# Patient Record
Sex: Male | Born: 1975 | Race: White | Hispanic: No | Marital: Single | Smoking: Current some day smoker
Health system: Southern US, Community
[De-identification: ages and names within clinical notes are randomized; demographics above are authoritative.]

---

## 2005-12-01 ENCOUNTER — Emergency Department: Payer: Self-pay | Admitting: Emergency Medicine

## 2006-08-06 HISTORY — PX: KNEE SURGERY: SHX244

## 2006-12-24 ENCOUNTER — Emergency Department: Payer: Self-pay | Admitting: Emergency Medicine

## 2007-10-24 ENCOUNTER — Emergency Department: Payer: Self-pay | Admitting: Unknown Physician Specialty

## 2007-10-25 ENCOUNTER — Other Ambulatory Visit: Payer: Self-pay

## 2007-11-01 ENCOUNTER — Emergency Department (HOSPITAL_COMMUNITY): Admission: EM | Admit: 2007-11-01 | Discharge: 2007-11-01 | Payer: Self-pay | Admitting: Emergency Medicine
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
## 2019-03-09 ENCOUNTER — Other Ambulatory Visit: Payer: Self-pay

## 2019-05-08 ENCOUNTER — Other Ambulatory Visit: Payer: Self-pay

## 2019-05-08 ENCOUNTER — Emergency Department: Payer: BLUE CROSS/BLUE SHIELD

## 2019-05-08 ENCOUNTER — Emergency Department
Admission: EM | Admit: 2019-05-08 | Discharge: 2019-05-08 | Disposition: A | Discharge status: Home / Self Care | Payer: BLUE CROSS/BLUE SHIELD | Attending: Emergency Medicine | Admitting: Emergency Medicine

## 2019-05-08 DIAGNOSIS — N453 Epididymo-orchitis: Secondary | ICD-10-CM

## 2019-05-08 DIAGNOSIS — N50819 Testicular pain, unspecified: Secondary | ICD-10-CM

## 2019-05-08 DIAGNOSIS — N50811 Right testicular pain: Secondary | ICD-10-CM | POA: Diagnosis present

## 2019-05-08 DIAGNOSIS — F17200 Nicotine dependence, unspecified, uncomplicated: Secondary | ICD-10-CM | POA: Insufficient documentation

## 2019-05-08 DIAGNOSIS — N5089 Other specified disorders of the male genital organs: Secondary | ICD-10-CM

## 2019-05-08 LAB — URINALYSIS, COMPLETE (UACMP) WITH MICROSCOPIC
Bacteria, UA: NONE SEEN
Bilirubin Urine: NEGATIVE
Glucose, UA: NEGATIVE mg/dL
Hgb urine dipstick: NEGATIVE
Ketones, ur: NEGATIVE mg/dL
Nitrite: NEGATIVE
Protein, ur: NEGATIVE mg/dL
Specific Gravity, Urine: 1.008 (ref 1.005–1.030)
Squamous Epithelial / LPF: NONE SEEN (ref 0–5)
pH: 6 (ref 5.0–8.0)

## 2019-05-08 MED ORDER — IBUPROFEN 800 MG PO TABS: 800.0000 mg | ORAL_TABLET | Freq: Three times a day (TID) | ORAL | 0 refills | Status: DC | PRN

## 2019-05-08 MED ORDER — LEVOFLOXACIN 500 MG PO TABS: 500.0000 mg | ORAL_TABLET | Freq: Every day | ORAL | 0 refills | Status: AC

## 2019-05-08 NOTE — ED Triage Notes (Signed)
Reports sudden onset of right testicular pain that started yesterday. Reports pain is not as sever as with onset but is still constant. Denies urinary sx. Pt alert and oriented X4, cooperative, RR even and unlabored, color WNL. Pt in NAD.

## 2019-05-08 NOTE — ED Notes (Signed)
Pt returned to lobby from U/S.

## 2019-05-08 NOTE — ED Notes (Signed)
Patient transported to Ultrasound from lobby

## 2019-05-08 NOTE — ED Notes (Signed)
Encouraged patient to provide urine sample, states unable to do so at this time. Given labeled urine cup and instructed to provide when able.

## 2019-05-08 NOTE — ED Provider Notes (Signed)
[facility] Emergency Department Provider Note       Time seen: ----------------------------------------- 4:29 PM on 05/08/2019 -----------------------------------------   I have reviewed the triage vital signs and the nursing notes.  HISTORY   Chief Complaint Testicle Pain    HPI Curtis Andrews is a 43 y.o. male with no significant past medical history who presents to the ED for right testicular pain that started yesterday.  Patient reports pain is not as severe as onset of yesterday but still constant.  He is never had this before.  Pain is 9 out of 10 in the right testicle.  History reviewed. No pertinent past medical history.  There are no active problems to display for this patient.   History reviewed. No pertinent surgical history.  Allergies Patient has no known allergies.  Social History Social History   Tobacco Use  . Smoking status: Current Some Day Smoker  Substance Use Topics  . Alcohol use: Yes  . Drug use: Not on file   Review of Systems Constitutional: Negative for fever. Cardiovascular: Negative for chest pain. Respiratory: Negative for shortness of breath. Gastrointestinal: Negative for abdominal pain, vomiting and diarrhea. Genitourinary: Positive for right testicular pain Musculoskeletal: Negative for back pain. Skin: Negative for rash. Neurological: Negative for headaches, focal weakness or numbness.  All systems negative/normal/unremarkable except as stated in the HPI  ____________________________________________   PHYSICAL EXAM:  VITAL SIGNS: ED Triage Vitals  Enc Vitals Group     BP 05/08/19 1125 (!) 142/82     Pulse Rate 05/08/19 1125 81     Resp 05/08/19 1125 16     Temp 05/08/19 1125 99 F (37.2 C)     Temp Source 05/08/19 1125 Oral     SpO2 05/08/19 1125 98 %     Weight 05/08/19 1131 165 lb (74.8 kg)     Height 05/08/19 1131 6' (1.829 m)     Head Circumference --      Peak Flow --      Pain Score  05/08/19 1131 9     Pain Loc --      Pain Edu? --      Excl. in GC? --     Constitutional: Alert and oriented. Well appearing and in no distress. Genitourinary: Right testicle is slightly enlarged and mildly tender.  He does have erythema of the scrotum on the right side.  No hernia is palpated. Musculoskeletal: Nontender with normal range of motion in extremities. No lower extremity tenderness nor edema. Neurologic:  Normal speech and language. No gross focal neurologic deficits are appreciated.  Skin:  Skin is warm, dry and intact. No rash noted. Psychiatric: Mood and affect are normal. Speech and behavior are normal.  ____________________________________________  ED COURSE:  As part of my medical decision making, I reviewed the following data within the electronic MEDICAL RECORD NUMBER History obtained from family if available, nursing notes, old chart and ekg, as well as notes from prior ED visits. Patient presented for right-sided testicular pain, we will assess with labs and imaging as indicated at this time.   Procedures  SUFYAAN PALMA was evaluated in Emergency Department on 05/08/2019 for the symptoms described in the history of present illness. He was evaluated in the context of the global COVID-19 pandemic, which necessitated consideration that the patient might be at risk for infection with the SARS-CoV-2 virus that causes COVID-19. Institutional protocols and algorithms that pertain to the evaluation of patients at risk for COVID-19 are in  a state of rapid change based on information released by regulatory bodies including the CDC and federal and state organizations. These policies and algorithms were followed during the patient's care in the ED.  ____________________________________________   LABS (pertinent positives/negatives)  Labs Reviewed  URINALYSIS, COMPLETE (UACMP) WITH MICROSCOPIC - Abnormal; Notable for the following components:      Result Value   Color, Urine YELLOW  (*)    APPearance CLEAR (*)    Leukocytes,Ua MODERATE (*)    All other components within normal limits  URINE CULTURE  GC/CHLAMYDIA PROBE AMP    RADIOLOGY Images were viewed by me Testicular ultrasound IMPRESSION: Small right hydrocele.  No evidence of testicular torsion.   ____________________________________________   DIFFERENTIAL DIAGNOSIS   Epididymoorchitis, torsion, hernia, kidney stone  FINAL ASSESSMENT AND PLAN  Right testicular pain   Plan: The patient had presented for right-sided testicular pain. Patient's labs revealed some leukocytes in his urine.  Patient's imaging revealed a small right hydrocele but otherwise no evidence of torsion.  Clinically has a normal lie and appearance to his testicle and scrotum although slightly enlarged and tender.  He states he has not had unprotected sex, I will cover with levofloxacin 500 mg orally for 10 days.  He will be referred to urology for outpatient follow-up as needed   Laurence Aly, MD    Note: This note was generated in part or whole with voice recognition software. Voice recognition is usually quite accurate but there are transcription errors that can and very often do occur. I apologize for any typographical errors that were not detected and corrected.     Earleen Newport, MD 05/08/19 [id number]

## 2019-05-08 NOTE — ED Notes (Signed)
Pt to first nurse desk, states that he is going to step to his car for a few minutes. Pt is ambulatory without distress at this time.

## 2019-05-10 LAB — URINE CULTURE
Culture: NO GROWTH
Special Requests: NORMAL

## 2019-05-11 LAB — GC/CHLAMYDIA PROBE AMP
Chlamydia trachomatis, NAA: NEGATIVE
Neisseria Gonorrhoeae by PCR: NEGATIVE

## 2020-01-08 IMAGING — US US SCROTUM W/ DOPPLER COMPLETE
1 series · 14 of 25 positions shown · non-contrast
Comparison: None.

CLINICAL DATA: Pain and swelling of the right testicle.

EXAM:
SCROTAL ULTRASOUND
DOPPLER ULTRASOUND OF THE TESTICLES
TECHNIQUE: Complete ultrasound examination of the testicles, epididymis, and
other scrotal structures was performed. Color and spectral Doppler
ultrasound were also utilized to evaluate blood flow to the
testicles.

[Series 1: us scrotum w/ doppler complete · 0.08mm/px · 14 of 43 slices shown]
[im 1/43]
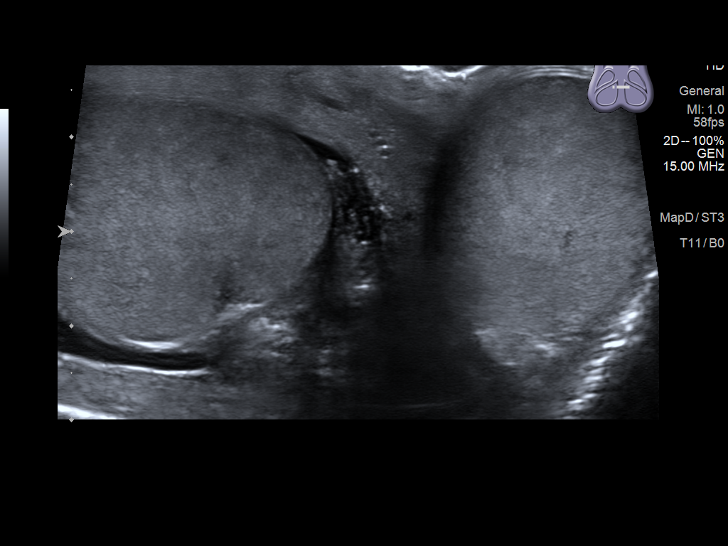
[im 4/43]
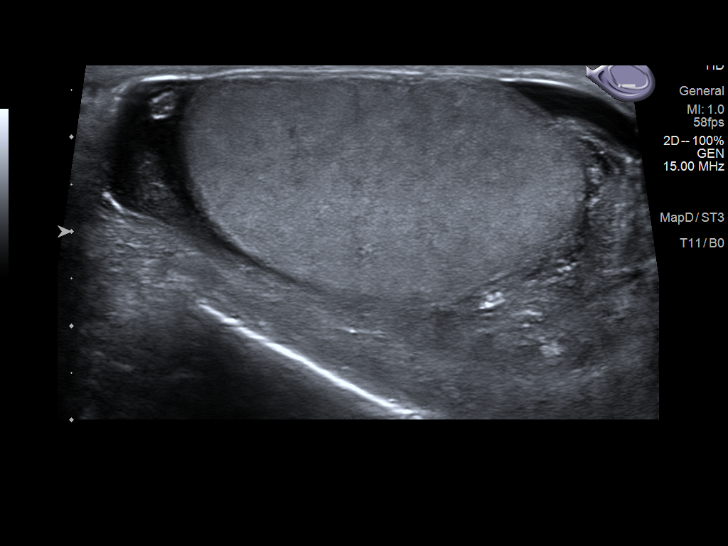
[im 8/43]
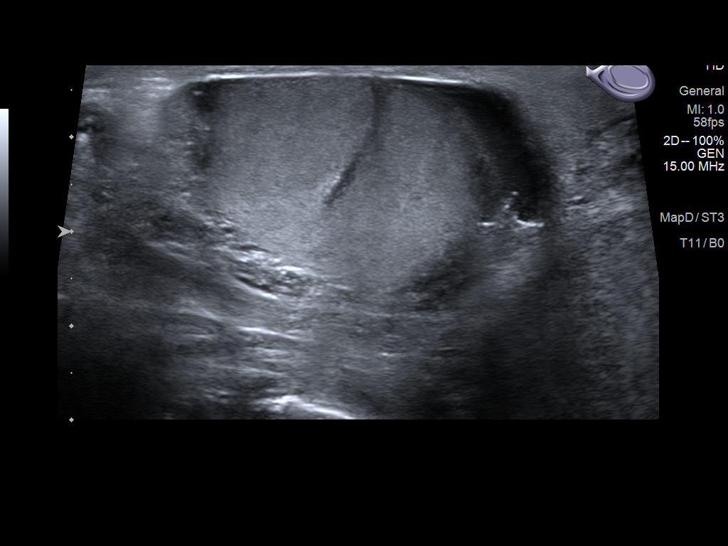
[im 11/43]
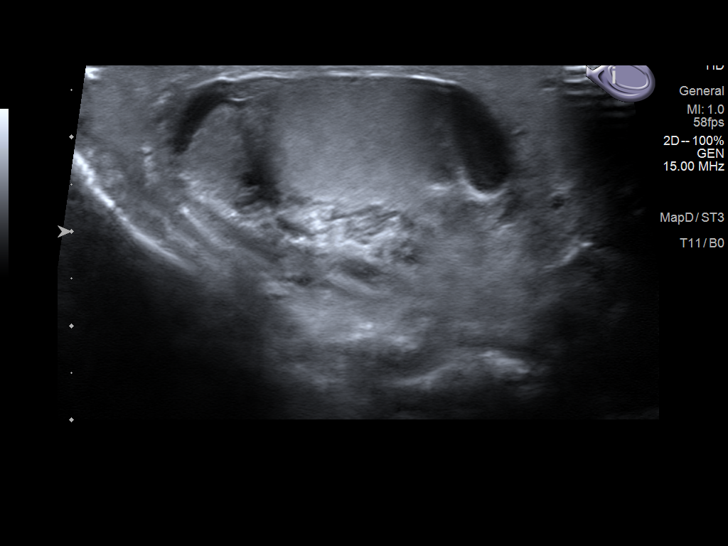
[im 15/43]
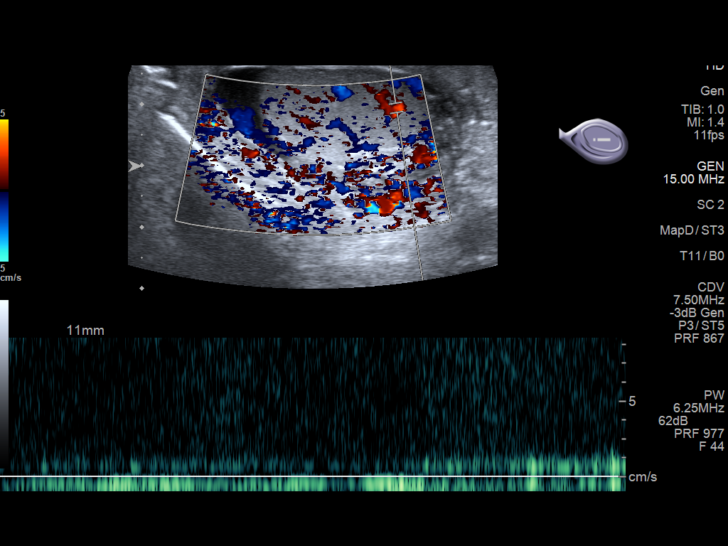
[im 16/43]
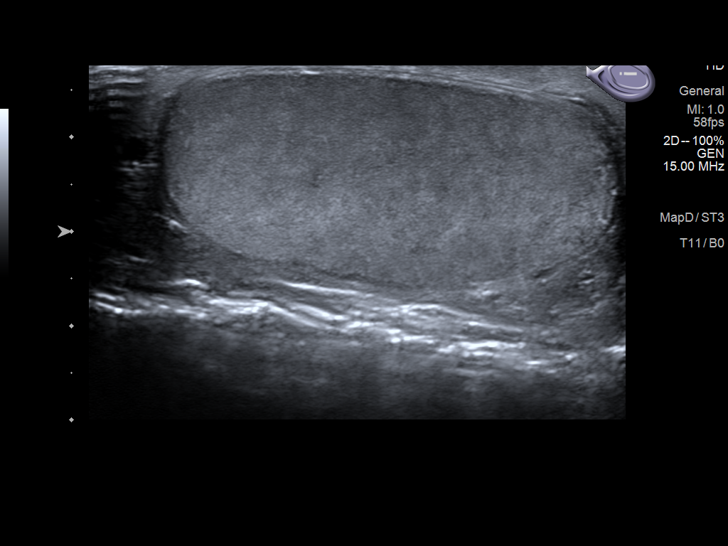
[im 20/43]
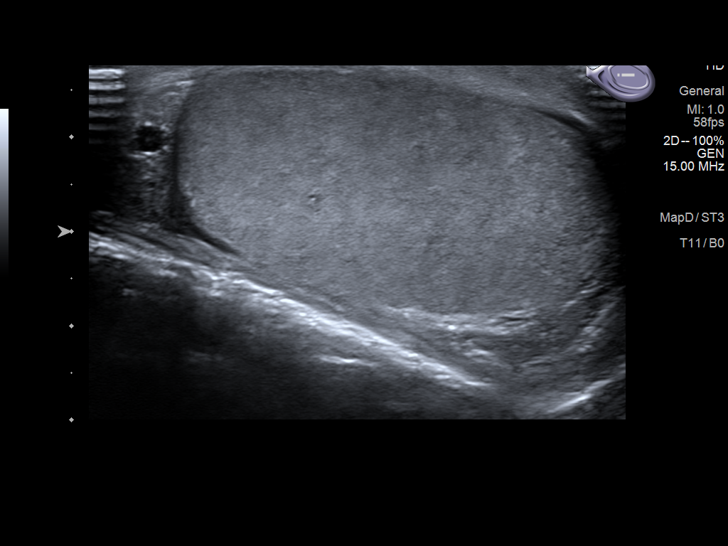
[im 23/43]
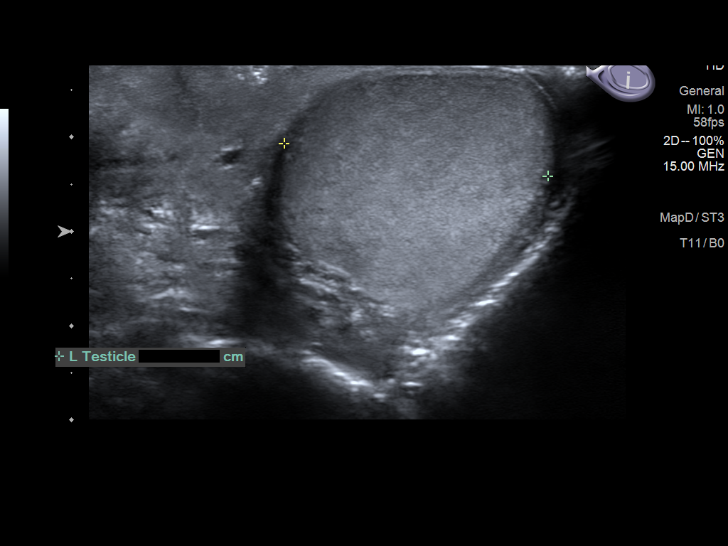
[im 27/43]
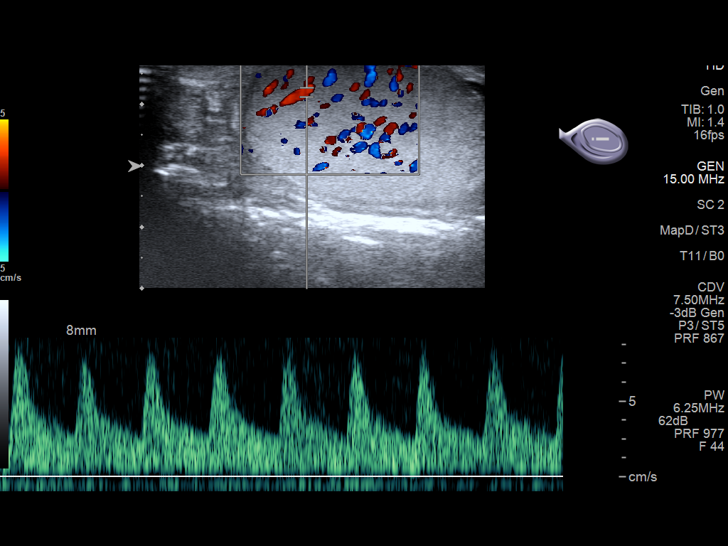
[im 29/43]
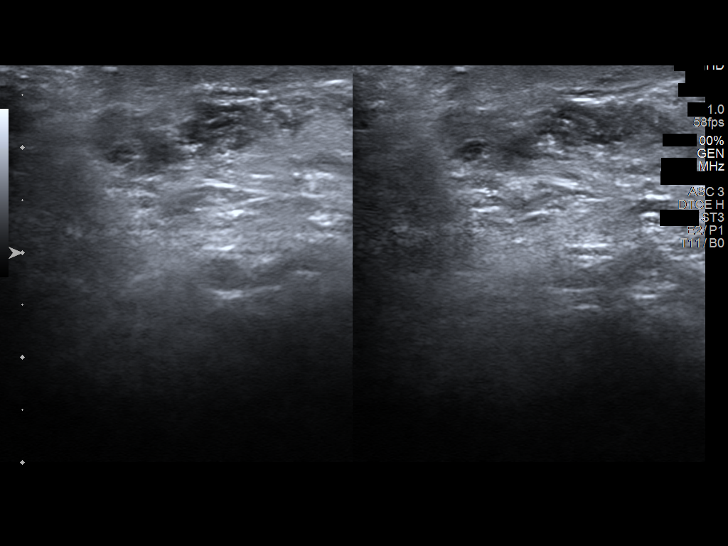
[im 32/43]
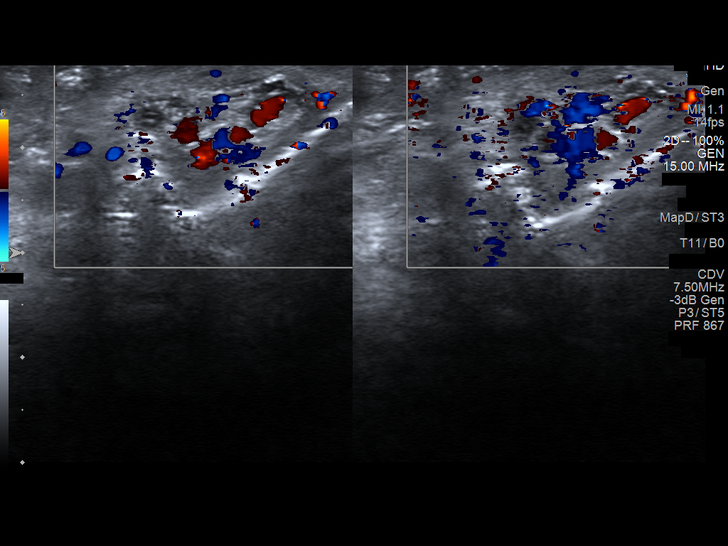
[im 36/43]
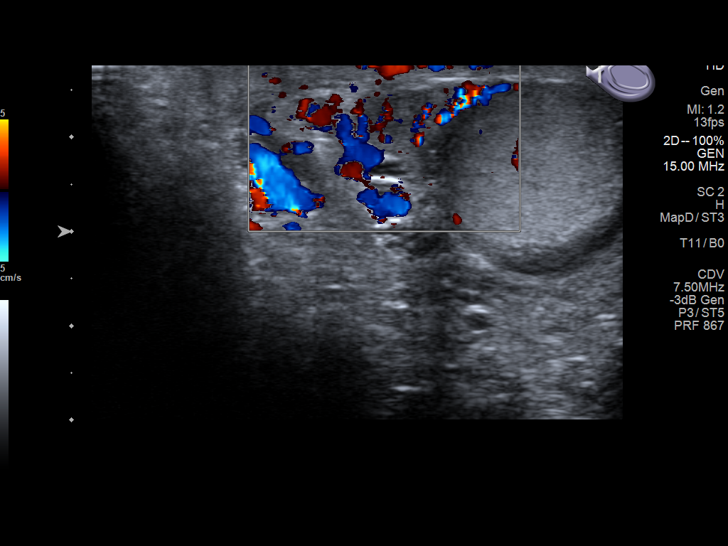
[im 39/43]
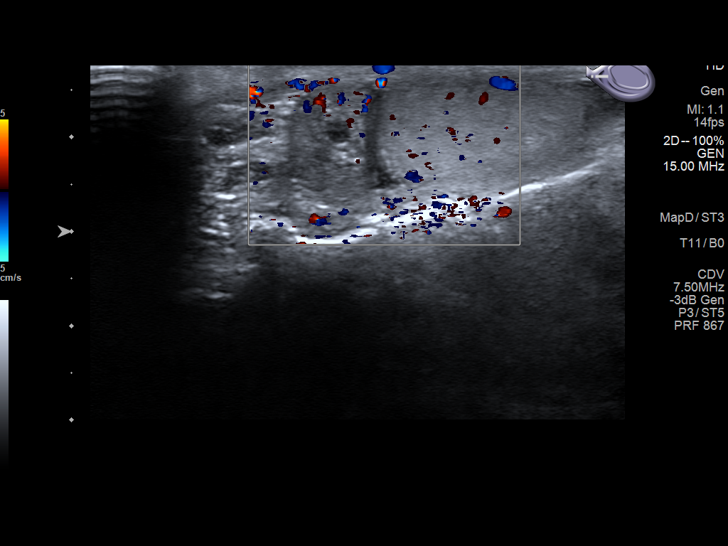
[im 43/43]
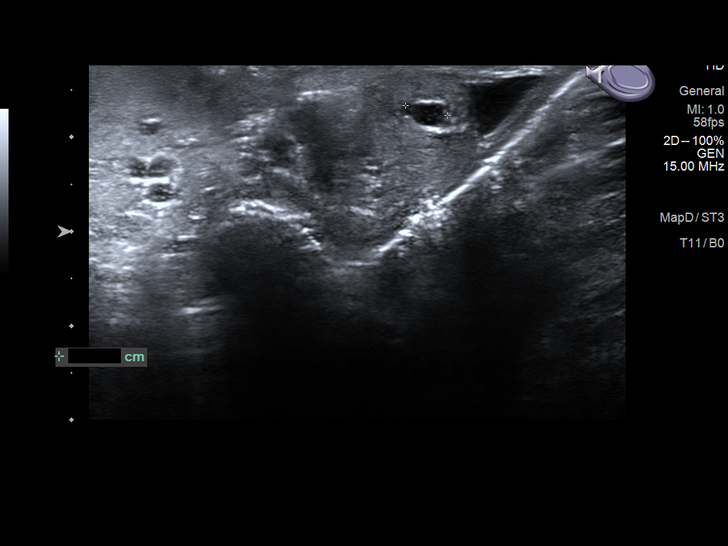

[14 of 25 positions shown; findings below may reference images not displayed]

FINDINGS: Right testicle

Measurements: 4.4 x 2.5 x 3.5 cm. No mass or microlithiasis
visualized.

Left testicle

Measurements: 4.8 x 2.2 x 2.8 cm. No mass or microlithiasis
visualized.

Right epididymis:  Normal in size and appearance.

Left epididymis: Normal in size. A small epididymal cyst measures 3
mm.

Hydrocele: There is a small right hydrocele. There is no left
hydrocele.

Varicocele:  None visualized.

Pulsed Doppler interrogation of both testes demonstrates normal low
resistance arterial and venous waveforms bilaterally.
IMPRESSION: Small right hydrocele.  No evidence of testicular torsion.

## 2022-07-25 ENCOUNTER — Telehealth: Payer: Self-pay | Admitting: Nurse Practitioner

## 2022-07-25 NOTE — Telephone Encounter (Signed)
Lvm to reschedule 08/24/22 appointment-Toni 

## 2022-08-21 ENCOUNTER — Encounter: Payer: Self-pay | Admitting: Nurse Practitioner

## 2022-08-21 ENCOUNTER — Ambulatory Visit (INDEPENDENT_AMBULATORY_CARE_PROVIDER_SITE_OTHER): Payer: BLUE CROSS/BLUE SHIELD | Admitting: Nurse Practitioner

## 2022-08-21 VITALS — BP 145/78 | HR 72 | Temp 97.8°F | Resp 16 | Ht 72.0 in | Wt 167.8 lb

## 2022-08-21 DIAGNOSIS — Z1212 Encounter for screening for malignant neoplasm of rectum: Secondary | ICD-10-CM | POA: Diagnosis not present

## 2022-08-21 DIAGNOSIS — E782 Mixed hyperlipidemia: Secondary | ICD-10-CM

## 2022-08-21 DIAGNOSIS — Z1211 Encounter for screening for malignant neoplasm of colon: Secondary | ICD-10-CM

## 2022-08-21 DIAGNOSIS — Z0000 Encounter for general adult medical examination without abnormal findings: Secondary | ICD-10-CM

## 2022-08-21 NOTE — Progress Notes (Signed)
[facility] [place], [place] [address]  Internal MEDICINE  Office Visit Note  Patient Name: Curtis Andrews  [id number]  [id number]  Date of Service: 08/21/2022   Complaints/HPI Pt is here for establishment of PCP. Chief Complaint  Patient presents with   New Patient (Initial Visit)   Quality Metric Gaps    Colonoscopy, history of colon cancer in family    HPI Charlton presents for a new patient visit to establish care.  Well-appearing 47 y.o. with type 2 bipolar.  Was going to [place], need to find new psychiatric provider  Has ADHD, wants to go back on amphetamine salts.  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  Alcohol use: 3-4 beers on weekends  Illicit drug use: none  Routine CRC screening: due now for colonoscopy  Labs: due for routine labs  New or worsening pain: none    Current Medication: Outpatient Encounter Medications as of 08/21/2022  Medication Sig   ibuprofen (ADVIL) 800 MG tablet Take 1 tablet (800 mg total) by mouth every 8 (eight) hours as needed.   lamoTRIgine (LAMICTAL) 200 MG tablet Take 200 mg by mouth 2 (two) times daily.   lithium carbonate 300 MG capsule Take 300 mg by mouth 3 (three) times daily. One tablet in the morning, two tablets in the evening.   PARoxetine (PAXIL) 20 MG tablet Take 20 mg by mouth daily.   No facility-administered encounter medications on file as of 08/21/2022.    Surgical History: Past Surgical History:  Procedure Laterality Date   KNEE SURGERY Left 2008    Medical History: History reviewed. No pertinent past medical history.  Family History: Family History  Problem Relation Age of Onset   Cancer Father    Cancer Paternal Grandmother    Cancer Paternal Grandfather     Social History   Socioeconomic History   Marital status:  Single    Spouse name: Not on file   Number of children: Not on file   Years of education: Not on file   Highest education level: Not on file  Occupational History   Not on file  Tobacco Use   Smoking status: Former    Types: Cigarettes   Smokeless tobacco: Not on file  Substance and Sexual Activity   Alcohol use: Yes   Drug use: Never   Sexual activity: Not Currently  Other Topics Concern   Not on file  Social History Narrative   Not on file   Social Determinants of Health   Financial Resource Strain: Not on file  Food Insecurity: Not on file  Transportation Needs: Not on file  Physical Activity: Not on file  Stress: Not on file  Social Connections: Not on file  Intimate Partner Violence: Not on file     Review of Systems  Constitutional:  Negative for chills, fatigue and unexpected weight change.  HENT:  Negative for congestion, rhinorrhea, sneezing and sore throat.   Eyes:  Negative for redness.  Respiratory:  Negative for cough, chest tightness and shortness of breath.   Cardiovascular:  Negative for chest pain and palpitations.  Gastrointestinal:  Negative for abdominal pain, constipation, diarrhea, nausea and vomiting.  Genitourinary:  Negative for dysuria and frequency.  Musculoskeletal:  Negative for arthralgias, back pain, joint swelling and neck pain.  Skin:  Negative for rash.  Neurological: Negative.  Negative for tremors and numbness.  Hematological:  Negative for adenopathy. Does not bruise/bleed easily.  Psychiatric/Behavioral:  Negative for behavioral problems (Depression), sleep disturbance and suicidal ideas. The patient is not nervous/anxious.     Vital Signs: BP (!) 145/78 Comment: 173/90  Pulse 72   Temp 97.8 F (36.6 C)   Resp 16   Ht 6' (1.829 m)   Wt 167 lb 12.8 oz (76.1 kg)   SpO2 96%   BMI 22.76 kg/m    Physical Exam Vitals reviewed.  Constitutional:      General: He is not in acute distress.    Appearance: Normal appearance.  He is normal weight. He is not ill-appearing.  HENT:     Head: Normocephalic and atraumatic.  Eyes:     Pupils: Pupils are equal, round, and reactive to light.  Cardiovascular:     Rate and Rhythm: Normal rate and regular rhythm.  Pulmonary:     Effort: Pulmonary effort is normal. No respiratory distress.  Neurological:     Mental Status: He is alert.  Psychiatric:        Mood and Affect: Mood normal.        Behavior: Behavior normal.       Assessment/Plan: 1. Mixed hyperlipidemia Routine labs ordered - CBC with Differential/Platelet - CMP14+EGFR - Lipid Profile  2. Routine health maintenance Routine labs ordered - CBC with Differential/Platelet - CMP14+EGFR - Lipid Profile  3. Screening for colorectal cancer Routine colonoscopy due - Ambulatory referral to Gastroenterology    General Counseling: Pattricia Boss understanding of the findings of todays visit and agrees with plan of treatment. I have discussed any further diagnostic evaluation that may be needed or ordered today. We also reviewed his medications today. he has been encouraged to call the office with any questions or concerns that should arise related to todays visit.    Orders Placed This Encounter  Procedures   CBC with Differential/Platelet   CMP14+EGFR   Lipid Profile    No orders of the defined types were placed in this encounter.   Return for CPE, Chantalle Defilippo PCP at earliest avilable opening. .  Time spent:30 Minutes Time spent with patient included reviewing progress notes, labs, imaging studies, and discussing plan for follow up.   [place] Controlled Substance Database was reviewed by me for overdose risk score (ORS)   This patient was seen by Jonetta Osgood, FNP-C in collaboration with Dr. Clayborn Bigness as a part of collaborative care agreement.   Jamicah Anstead R. Valetta Fuller, MSN, FNP-C Internal Medicine

## 2022-08-24 ENCOUNTER — Ambulatory Visit: Payer: BLUE CROSS/BLUE SHIELD | Admitting: Nurse Practitioner

## 2022-09-04 LAB — CBC WITH DIFFERENTIAL/PLATELET
Basophils Absolute: 0.1 10*3/uL (ref 0.0–0.2)
Basos: 1 %
EOS (ABSOLUTE): 0.3 10*3/uL (ref 0.0–0.4)
Eos: 5 %
Hematocrit: 44.7 % (ref 37.5–51.0)
Hemoglobin: 15.8 g/dL (ref 13.0–17.7)
Immature Grans (Abs): 0 10*3/uL (ref 0.0–0.1)
Immature Granulocytes: 0 %
Lymphocytes Absolute: 1.4 10*3/uL (ref 0.7–3.1)
Lymphs: 26 %
MCH: 31.1 pg (ref 26.6–33.0)
MCHC: 35.3 g/dL (ref 31.5–35.7)
MCV: 88 fL (ref 79–97)
Monocytes Absolute: 0.5 10*3/uL (ref 0.1–0.9)
Monocytes: 9 %
Neutrophils Absolute: 3.3 10*3/uL (ref 1.4–7.0)
Neutrophils: 59 %
Platelets: 322 10*3/uL (ref 150–450)
RBC: 5.08 x10E6/uL (ref 4.14–5.80)
RDW: 12.8 % (ref 11.6–15.4)
WBC: 5.6 10*3/uL (ref 3.4–10.8)

## 2022-09-04 LAB — CMP14+EGFR
ALT: 34 IU/L (ref 0–44)
AST: 29 IU/L (ref 0–40)
Albumin/Globulin Ratio: 2.1 (ref 1.2–2.2)
Albumin: 4.9 g/dL (ref 4.1–5.1)
Alkaline Phosphatase: 85 IU/L (ref 44–121)
BUN/Creatinine Ratio: 18 (ref 9–20)
BUN: 17 mg/dL (ref 6–24)
Bilirubin Total: 0.4 mg/dL (ref 0.0–1.2)
CO2: 23 mmol/L (ref 20–29)
Calcium: 9.9 mg/dL (ref 8.7–10.2)
Chloride: 100 mmol/L (ref 96–106)
Creatinine, Ser: 0.93 mg/dL (ref 0.76–1.27)
Globulin, Total: 2.3 g/dL (ref 1.5–4.5)
Glucose: 111 mg/dL — ABNORMAL HIGH (ref 70–99)
Potassium: 4.4 mmol/L (ref 3.5–5.2)
Sodium: 138 mmol/L (ref 134–144)
Total Protein: 7.2 g/dL (ref 6.0–8.5)
eGFR: 101 mL/min/{1.73_m2} (ref >59)

## 2022-09-04 LAB — LIPID PANEL
Chol/HDL Ratio: 2.5 ratio (ref 0.0–5.0)
Cholesterol, Total: 209 mg/dL — ABNORMAL HIGH (ref 100–199)
HDL: 82 mg/dL (ref >39)
LDL Chol Calc (NIH): 107 mg/dL — ABNORMAL HIGH (ref 0–99)
Triglycerides: 116 mg/dL (ref 0–149)
VLDL Cholesterol Cal: 20 mg/dL (ref 5–40)

## 2022-09-11 NOTE — Progress Notes (Signed)
We will discuss lab results at upcoming office visit

## 2022-09-12 ENCOUNTER — Telehealth: Payer: Self-pay

## 2022-09-13 MED ORDER — LAMOTRIGINE 200 MG PO TABS: 200.0000 mg | ORAL_TABLET | Freq: Two times a day (BID) | ORAL | 2 refills | Status: DC

## 2022-09-13 MED ORDER — PAROXETINE HCL 20 MG PO TABS: 20.0000 mg | ORAL_TABLET | Freq: Every day | ORAL | 2 refills | Status: DC

## 2022-09-13 NOTE — Telephone Encounter (Signed)
Pt notified that we send med  

## 2022-09-20 ENCOUNTER — Telehealth: Payer: Self-pay | Admitting: Nurse Practitioner

## 2022-09-20 ENCOUNTER — Encounter: Payer: BLUE CROSS/BLUE SHIELD | Admitting: Nurse Practitioner

## 2022-09-20 NOTE — Telephone Encounter (Signed)
Lvm to move 09/20/22 appointment-Curtis Andrews

## 2022-09-26 ENCOUNTER — Encounter: Payer: BLUE CROSS/BLUE SHIELD | Admitting: Nurse Practitioner

## 2022-10-17 ENCOUNTER — Ambulatory Visit (INDEPENDENT_AMBULATORY_CARE_PROVIDER_SITE_OTHER): Payer: BLUE CROSS/BLUE SHIELD | Admitting: Nurse Practitioner

## 2022-10-17 ENCOUNTER — Encounter: Payer: Self-pay | Admitting: Nurse Practitioner

## 2022-10-17 VITALS — BP 141/82 | HR 76 | Temp 98.6°F | Resp 16 | Ht 72.0 in | Wt 165.2 lb

## 2022-10-17 DIAGNOSIS — Z0001 Encounter for general adult medical examination with abnormal findings: Secondary | ICD-10-CM | POA: Diagnosis not present

## 2022-10-17 DIAGNOSIS — F908 Attention-deficit hyperactivity disorder, other type: Secondary | ICD-10-CM

## 2022-10-17 DIAGNOSIS — E782 Mixed hyperlipidemia: Secondary | ICD-10-CM

## 2022-10-17 DIAGNOSIS — F3181 Bipolar II disorder: Secondary | ICD-10-CM

## 2022-10-17 MED ORDER — LAMOTRIGINE 200 MG PO TABS: 200.0000 mg | ORAL_TABLET | Freq: Two times a day (BID) | ORAL | 1 refills | Status: DC

## 2022-10-17 MED ORDER — PAROXETINE HCL 20 MG PO TABS: 20.0000 mg | ORAL_TABLET | Freq: Every day | ORAL | 1 refills | Status: DC

## 2022-10-17 MED ORDER — LITHIUM CARBONATE 300 MG PO CAPS: 300.0000 mg | ORAL_CAPSULE | Freq: Three times a day (TID) | ORAL | 1 refills | Status: DC

## 2022-10-17 MED ORDER — AMPHETAMINE-DEXTROAMPHETAMINE 5 MG PO TABS: 5.0000 mg | ORAL_TABLET | Freq: Two times a day (BID) | ORAL | 0 refills | Status: DC

## 2022-10-17 NOTE — Progress Notes (Signed)
[facility] [place], [place] [address]  Internal MEDICINE  Office Visit Note  Patient Name: Curtis Andrews  [id number]  [id number]  Date of Service: 10/17/2022  Chief Complaint  Patient presents with   Annual Exam    HPI Voyd presents for an annual well visit and physical exam.  Well-appearing 47 y.o.  male with ADHD and type 2 bipolar.  He is stable and has been on his current medications for a long time.  He used to be on adderall but took a break from it for a while after he was done with school. He would like to start it again to help improve his focus and concentration.  Routine CRC screening: will call [facility] back to make appointment Labs: lab results discussed and were mostly normal New or worsening pain: none  Other concerns: none    Current Medication: Outpatient Encounter Medications as of 10/17/2022  Medication Sig   amphetamine-dextroamphetamine (ADDERALL) 5 MG tablet Take 1 tablet (5 mg total) by mouth 2 (two) times daily.   [DISCONTINUED] ibuprofen (ADVIL) 800 MG tablet Take 1 tablet (800 mg total) by mouth every 8 (eight) hours as needed.   [DISCONTINUED] lamoTRIgine (LAMICTAL) 200 MG tablet Take 1 tablet (200 mg total) by mouth 2 (two) times daily.   [DISCONTINUED] lithium carbonate 300 MG capsule Take 300 mg by mouth 3 (three) times daily. One tablet in the morning, two tablets in the evening.   [DISCONTINUED] PARoxetine (PAXIL) 20 MG tablet Take 1 tablet (20 mg total) by mouth daily.   lamoTRIgine (LAMICTAL) 200 MG tablet Take 1 tablet (200 mg total) by mouth 2 (two) times daily.   lithium carbonate 300 MG capsule Take 1 capsule (300 mg total) by mouth 3 (three) times daily. One tablet in the morning, two tablets in the evening.   PARoxetine (PAXIL) 20 MG tablet Take 1 tablet (20 mg total) by mouth daily.   No facility-administered encounter medications on file as of 10/17/2022.    Surgical History: Past Surgical History:   Procedure Laterality Date   KNEE SURGERY Left 2008    Medical History: History reviewed. No pertinent past medical history.  Family History: Family History  Problem Relation Age of Onset   Cancer Father    Cancer Paternal Grandmother    Cancer Paternal Grandfather     Social History   Socioeconomic History   Marital status: Single    Spouse name: Not on file   Number of children: Not on file   Years of education: Not on file   Highest education level: Not on file  Occupational History   Not on file  Tobacco Use   Smoking status: Former    Types: Cigarettes   Smokeless tobacco: Not on file  Substance and Sexual Activity   Alcohol use: Yes   Drug use: Never   Sexual activity: Not Currently  Other Topics Concern   Not on file  Social History Narrative   Not on file   Social Determinants of Health   Financial Resource Strain: Not on file  Food Insecurity: Not on file  Transportation Needs: Not on file  Physical Activity: Not on file  Stress: Not on file  Social Connections: Not on file  Intimate Partner Violence: Not on file      Review of Systems  Constitutional:  Negative for activity change, appetite change, chills, fatigue, fever and unexpected weight change.  HENT: Negative.  Negative for congestion, ear pain, rhinorrhea, sore  throat and trouble swallowing.   Eyes: Negative.   Respiratory: Negative.  Negative for cough, chest tightness, shortness of breath and wheezing.   Cardiovascular: Negative.  Negative for chest pain and palpitations.  Gastrointestinal: Negative.  Negative for abdominal pain, blood in stool, constipation, diarrhea, nausea and vomiting.  Endocrine: Negative.   Genitourinary: Negative.  Negative for difficulty urinating, dysuria, frequency, hematuria and urgency.  Musculoskeletal: Negative.  Negative for arthralgias, back pain, joint swelling, myalgias and neck pain.  Skin: Negative.  Negative for rash and wound.   Allergic/Immunologic: Negative.  Negative for immunocompromised state.  Neurological: Negative.  Negative for dizziness, seizures, numbness and headaches.  Hematological: Negative.   Psychiatric/Behavioral:  Positive for behavioral problems and decreased concentration. Negative for self-injury, sleep disturbance and suicidal ideas. The patient is nervous/anxious.     Vital Signs: BP (!) 141/82   Pulse 76   Temp 98.6 F (37 C)   Resp 16   Ht 6' (1.829 m)   Wt 165 lb 3.2 oz (74.9 kg)   SpO2 96%   BMI 22.41 kg/m    Physical Exam Vitals reviewed.  Constitutional:      General: He is not in acute distress.    Appearance: Normal appearance. He is well-developed and normal weight. He is not ill-appearing or diaphoretic.  HENT:     Head: Normocephalic and atraumatic.     Right Ear: Tympanic membrane, ear canal and external ear normal. There is no impacted cerumen.     Left Ear: Tympanic membrane, ear canal and external ear normal. There is no impacted cerumen.     Nose: Nose normal. No congestion or rhinorrhea.     Mouth/Throat:     Mouth: Mucous membranes are moist.     Pharynx: Oropharynx is clear. No oropharyngeal exudate or posterior oropharyngeal erythema.  Eyes:     General: Lids are normal. Vision grossly intact. Gaze aligned appropriately. No scleral icterus.       Right eye: No discharge.        Left eye: No discharge.     Extraocular Movements: Extraocular movements intact.     Conjunctiva/sclera: Conjunctivae normal.     Pupils: Pupils are equal, round, and reactive to light.  Neck:     Thyroid: No thyromegaly.     Vascular: No JVD.     Trachea: No tracheal deviation.  Cardiovascular:     Rate and Rhythm: Normal rate and regular rhythm.     Pulses: Normal pulses.     Heart sounds: Normal heart sounds, S1 normal and S2 normal. No murmur heard.    No friction rub. No gallop.  Pulmonary:     Effort: Pulmonary effort is normal. No respiratory distress.     Breath  sounds: Normal breath sounds. No stridor. No wheezing or rales.  Chest:     Chest wall: No tenderness.  Abdominal:     General: Bowel sounds are normal. There is no distension.     Palpations: Abdomen is soft. There is no mass.     Tenderness: There is no abdominal tenderness. There is no guarding or rebound.  Musculoskeletal:        General: No tenderness or deformity. Normal range of motion.     Cervical back: Normal range of motion and neck supple.  Lymphadenopathy:     Cervical: No cervical adenopathy.  Skin:    General: Skin is warm and dry.     Capillary Refill: Capillary refill takes less than 2 seconds.  Coloration: Skin is not pale.     Findings: No erythema or rash.  Neurological:     Mental Status: He is alert and oriented to person, place, and time.     Cranial Nerves: No cranial nerve deficit.     Motor: No abnormal muscle tone.     Coordination: Coordination normal.     Gait: Gait normal.     Deep Tendon Reflexes: Reflexes are normal and symmetric.  Psychiatric:        Mood and Affect: Mood normal.        Behavior: Behavior normal.        Thought Content: Thought content normal.        Judgment: Judgment normal.        Assessment/Plan: 1. Encounter for routine adult health examination with abnormal findings Age-appropriate preventive screenings and vaccinations discussed, annual physical exam completed. Routine labs for health maintenance results discussed with patient today. PHM updated.   2. Mixed hyperlipidemia Slightly elevated LDL 107, cnbtinue current diet and lifestyle modifications   3. Bipolar 2 disorder ([place]) Continue current medications as prescribed, refills ordered - lamoTRIgine (LAMICTAL) 200 MG tablet; Take 1 tablet (200 mg total) by mouth 2 (two) times daily.  Dispense: 180 tablet; Refill: 1 - lithium carbonate 300 MG capsule; Take 1 capsule (300 mg total) by mouth 3 (three) times daily. One tablet in the morning, two tablets in the  evening.  Dispense: 270 capsule; Refill: 1 - PARoxetine (PAXIL) 20 MG tablet; Take 1 tablet (20 mg total) by mouth daily.  Dispense: 90 tablet; Refill: 1  4. Attention deficit hyperactivity disorder (ADHD), other type Start adderall 5 mg twice daily , follow up in 4 weeks  - amphetamine-dextroamphetamine (ADDERALL) 5 MG tablet; Take 1 tablet (5 mg total) by mouth 2 (two) times daily.  Dispense: 60 tablet; Refill: 0     General Counseling: Brandon verbalizes understanding of the findings of todays visit and agrees with plan of treatment. I have discussed any further diagnostic evaluation that may be needed or ordered today. We also reviewed his medications today. he has been encouraged to call the office with any questions or concerns that should arise related to todays visit.    No orders of the defined types were placed in this encounter.   Meds ordered this encounter  Medications   amphetamine-dextroamphetamine (ADDERALL) 5 MG tablet    Sig: Take 1 tablet (5 mg total) by mouth 2 (two) times daily.    Dispense:  60 tablet    Refill:  0    New script, fill today   lamoTRIgine (LAMICTAL) 200 MG tablet    Sig: Take 1 tablet (200 mg total) by mouth 2 (two) times daily.    Dispense:  180 tablet    Refill:  1   lithium carbonate 300 MG capsule    Sig: Take 1 capsule (300 mg total) by mouth 3 (three) times daily. One tablet in the morning, two tablets in the evening.    Dispense:  270 capsule    Refill:  1   PARoxetine (PAXIL) 20 MG tablet    Sig: Take 1 tablet (20 mg total) by mouth daily.    Dispense:  90 tablet    Refill:  1    Return in about 4 weeks (around 11/14/2022) for F/U, eval new med, Faraaz Wolin PCP adderall.   Total time spent:30 Minutes Time spent includes review of chart, medications, test results, and follow up plan with the patient.  [place] Controlled Substance Database was reviewed by me.  This patient was seen by Jonetta Osgood, FNP-C in collaboration with Dr. Clayborn Bigness as a part of collaborative care agreement.  Tavares Levinson R. Valetta Fuller, MSN, FNP-C Internal medicine

## 2022-11-16 ENCOUNTER — Ambulatory Visit: Payer: BLUE CROSS/BLUE SHIELD | Admitting: Nurse Practitioner

## 2022-12-04 ENCOUNTER — Telehealth: Payer: Self-pay | Admitting: Nurse Practitioner

## 2022-12-04 DIAGNOSIS — F908 Attention-deficit hyperactivity disorder, other type: Secondary | ICD-10-CM

## 2022-12-05 MED ORDER — AMPHETAMINE-DEXTROAMPHETAMINE 5 MG PO TABS: 5.0000 mg | ORAL_TABLET | Freq: Two times a day (BID) | ORAL | 0 refills | Status: DC

## 2022-12-05 NOTE — Telephone Encounter (Signed)
Lmom we sent med for 7 days

## 2022-12-11 ENCOUNTER — Encounter: Payer: Self-pay | Admitting: Nurse Practitioner

## 2022-12-11 ENCOUNTER — Ambulatory Visit: Payer: BLUE CROSS/BLUE SHIELD | Admitting: Nurse Practitioner

## 2022-12-11 VITALS — BP 120/70 | HR 74 | Temp 98.4°F | Resp 16 | Ht 72.0 in | Wt 160.8 lb

## 2022-12-11 DIAGNOSIS — F908 Attention-deficit hyperactivity disorder, other type: Secondary | ICD-10-CM | POA: Diagnosis not present

## 2022-12-11 DIAGNOSIS — R41840 Attention and concentration deficit: Secondary | ICD-10-CM | POA: Diagnosis not present

## 2022-12-11 MED ORDER — AMPHETAMINE-DEXTROAMPHETAMINE 10 MG PO TABS: 10.0000 mg | ORAL_TABLET | Freq: Two times a day (BID) | ORAL | 0 refills | Status: AC

## 2022-12-11 NOTE — Progress Notes (Signed)
[facility] [address] [place], [place] [address]  Internal MEDICINE  Office Visit Note  Patient Name: Curtis Andrews  [id number]  [id number]  Date of Service: 12/11/2022  Chief Complaint  Patient presents with   Follow-up   Quality Metric Gaps    Colonoscopy    HPI Darrington presents for a follow-up visit for adderall refills  ADHD -- takes adderall, doing well, denies any adverse side effects or palpitations.  Heart rate and BP are normal. Wants to increase dose.  He is moving away soon and needs a 3 month supply if possible    Current Medication: Outpatient Encounter Medications as of 12/11/2022  Medication Sig   amphetamine-dextroamphetamine (ADDERALL) 10 MG tablet Take 1 tablet (10 mg total) by mouth 2 (two) times daily.   lamoTRIgine (LAMICTAL) 200 MG tablet Take 1 tablet (200 mg total) by mouth 2 (two) times daily.   lithium carbonate 300 MG capsule Take 1 capsule (300 mg total) by mouth 3 (three) times daily. One tablet in the morning, two tablets in the evening.   PARoxetine (PAXIL) 20 MG tablet Take 1 tablet (20 mg total) by mouth daily.   [DISCONTINUED] amphetamine-dextroamphetamine (ADDERALL) 5 MG tablet Take 1 tablet (5 mg total) by mouth 2 (two) times daily.   No facility-administered encounter medications on file as of 12/11/2022.    Surgical History: Past Surgical History:  Procedure Laterality Date   KNEE SURGERY Left 2008    Medical History: History reviewed. No pertinent past medical history.  Family History: Family History  Problem Relation Age of Onset   Cancer Father    Cancer Paternal Grandmother    Cancer Paternal Grandfather     Social History   Socioeconomic History   Marital status: Single    Spouse name: Not on file   Number of children: Not on file   Years of education: Not on file   Highest education level: Not on file  Occupational History   Not on file  Tobacco Use   Smoking status: Some Days    Types: Cigarettes    Smokeless tobacco: Not on file   Tobacco comments:    Socially  Substance and Sexual Activity   Alcohol use: Yes   Drug use: Never   Sexual activity: Not Currently  Other Topics Concern   Not on file  Social History Narrative   Not on file   Social Determinants of Health   Financial Resource Strain: Not on file  Food Insecurity: Not on file  Transportation Needs: Not on file  Physical Activity: Not on file  Stress: Not on file  Social Connections: Not on file  Intimate Partner Violence: Not on file      Review of Systems  Constitutional:  Negative for chills, fatigue and unexpected weight change.  HENT:  Negative for congestion, rhinorrhea, sneezing and sore throat.   Eyes:  Negative for redness.  Respiratory:  Negative for cough, chest tightness and shortness of breath.   Cardiovascular:  Negative for chest pain and palpitations.  Gastrointestinal:  Negative for abdominal pain, constipation, diarrhea, nausea and vomiting.  Genitourinary:  Negative for dysuria and frequency.  Musculoskeletal:  Negative for arthralgias, back pain, joint swelling and neck pain.  Skin:  Negative for rash.  Neurological: Negative.  Negative for tremors and numbness.  Hematological:  Negative for adenopathy. Does not bruise/bleed easily.  Psychiatric/Behavioral:  Negative for behavioral problems (Depression), sleep disturbance and suicidal ideas. The patient is not nervous/anxious.  Vital Signs: BP 120/70   Pulse 74   Temp 98.4 F (36.9 C)   Resp 16   Ht 6' (1.829 m)   Wt 160 lb 12.8 oz (72.9 kg)   SpO2 96%   BMI 21.81 kg/m    Physical Exam Vitals reviewed.  Constitutional:      General: He is not in acute distress.    Appearance: Normal appearance. He is normal weight. He is not ill-appearing.  HENT:     Head: Normocephalic and atraumatic.  Eyes:     Pupils: Pupils are equal, round, and reactive to light.  Cardiovascular:     Rate and Rhythm: Normal rate and regular  rhythm.  Pulmonary:     Effort: Pulmonary effort is normal. No respiratory distress.  Neurological:     Mental Status: He is alert.  Psychiatric:        Mood and Affect: Mood normal.        Behavior: Behavior normal.        Assessment/Plan: 1. Attention deficit Continue medication at new dose x3 months and then continue to reassess effectiveness of dose with new PCP once you establish in your new town/city - amphetamine-dextroamphetamine (ADDERALL) 10 MG tablet; Take 1 tablet (10 mg total) by mouth 2 (two) times daily.  Dispense: 180 tablet; Refill: 0  2. Attention deficit hyperactivity disorder (ADHD), other type 3 month supply prescribed, patient is moving and will need time to find a new PCP and establish.  - amphetamine-dextroamphetamine (ADDERALL) 10 MG tablet; Take 1 tablet (10 mg total) by mouth 2 (two) times daily.  Dispense: 180 tablet; Refill: 0   General Counseling: Elijaha verbalizes understanding of the findings of todays visit and agrees with plan of treatment. I have discussed any further diagnostic evaluation that may be needed or ordered today. We also reviewed his medications today. he has been encouraged to call the office with any questions or concerns that should arise related to todays visit.    No orders of the defined types were placed in this encounter.   Meds ordered this encounter  Medications   amphetamine-dextroamphetamine (ADDERALL) 10 MG tablet    Sig: Take 1 tablet (10 mg total) by mouth 2 (two) times daily.    Dispense:  180 tablet    Refill:  0    Please fill for 90 day supply, patient is moving.    Return for no follow up, 3 month supply of med ordered, patient is moving away soon.   Total time spent:20 Minutes Time spent includes review of chart, medications, test results, and follow up plan with the patient.   [place] Controlled Substance Database was reviewed by me.  This patient was seen by Sallyanne Kuster, FNP-C in collaboration with  Dr. Beverely Risen as a part of collaborative care agreement.   Jessika Rothery R. Tedd Sias, MSN, FNP-C Internal medicine

## 2022-12-22 ENCOUNTER — Encounter: Payer: Self-pay | Admitting: Nurse Practitioner

## 2023-02-20 ENCOUNTER — Telehealth: Payer: Self-pay | Admitting: Nurse Practitioner

## 2023-02-20 NOTE — Telephone Encounter (Signed)
Pt advised we  unable to send it he  had refills at his phar he can fill at [facility] in [place]

## 2023-02-20 NOTE — Telephone Encounter (Signed)
Lmom to call us back as per alyssa we are unable to do med

## 2023-05-06 ENCOUNTER — Telehealth: Payer: Self-pay

## 2023-05-06 DIAGNOSIS — F3181 Bipolar II disorder: Secondary | ICD-10-CM

## 2023-05-06 NOTE — Telephone Encounter (Signed)
Left message for patient to give office a call back.  

## 2023-05-15 ENCOUNTER — Other Ambulatory Visit: Payer: Self-pay

## 2023-05-15 MED ORDER — PAROXETINE HCL 20 MG PO TABS: 20.0000 mg | ORAL_TABLET | Freq: Every day | ORAL | 0 refills | Status: AC

## 2023-05-15 MED ORDER — LAMOTRIGINE 200 MG PO TABS: 200.0000 mg | ORAL_TABLET | Freq: Two times a day (BID) | ORAL | 0 refills | Status: AC

## 2023-05-15 MED ORDER — LITHIUM CARBONATE 300 MG PO CAPS: 300.0000 mg | ORAL_CAPSULE | Freq: Three times a day (TID) | ORAL | 0 refills | Status: AC

## 2023-05-15 NOTE — Telephone Encounter (Signed)
Lmom that we sent med for 1 month he need to find  another dr or need appt for further refills

## 2023-05-15 NOTE — Telephone Encounter (Signed)
Lmom that which phar you like [place] to send med
